# Patient Record
Sex: Male | Born: 1980 | Race: White | Hispanic: No | Marital: Single | Smoking: Never smoker
Health system: Southern US, Community
[De-identification: ages and names within clinical notes are randomized; demographics above are authoritative.]

## PROBLEM LIST (undated history)

## (undated) DIAGNOSIS — S060X9A Concussion with loss of consciousness of unspecified duration, initial encounter: Secondary | ICD-10-CM

## (undated) DIAGNOSIS — E119 Type 2 diabetes mellitus without complications: Secondary | ICD-10-CM

## (undated) HISTORY — DX: Concussion with loss of consciousness of unspecified duration, initial encounter: S06.0X9A

---

## 2002-07-25 HISTORY — PX: WISDOM TOOTH EXTRACTION: SHX21

## 2005-09-13 ENCOUNTER — Ambulatory Visit: Payer: Self-pay | Admitting: Family Medicine

## 2006-06-29 ENCOUNTER — Emergency Department: Payer: Self-pay | Admitting: Unknown Physician Specialty

## 2006-10-04 ENCOUNTER — Ambulatory Visit: Payer: Self-pay | Admitting: Orthopaedic Surgery

## 2006-11-27 ENCOUNTER — Ambulatory Visit: Payer: Self-pay | Admitting: Orthopaedic Surgery

## 2007-07-26 HISTORY — PX: ROTATOR CUFF REPAIR: SHX139

## 2013-02-03 ENCOUNTER — Emergency Department: Payer: Self-pay | Admitting: Emergency Medicine

## 2013-02-03 LAB — CBC
HCT: 41.5 % (ref 40.0–52.0)
HGB: 14.4 g/dL (ref 13.0–18.0)
MCH: 31.3 pg (ref 26.0–34.0)
MCHC: 34.8 g/dL (ref 32.0–36.0)
Platelet: 168 10*3/uL (ref 150–440)
RBC: 4.62 10*6/uL (ref 4.40–5.90)
RDW: 12.4 % (ref 11.5–14.5)

## 2013-02-10 ENCOUNTER — Emergency Department: Payer: Self-pay | Admitting: Internal Medicine

## 2014-02-19 ENCOUNTER — Emergency Department: Payer: Self-pay | Admitting: Emergency Medicine

## 2015-03-11 ENCOUNTER — Telehealth: Payer: Self-pay

## 2015-03-11 ENCOUNTER — Encounter: Payer: Self-pay | Admitting: Family Medicine

## 2015-03-11 ENCOUNTER — Ambulatory Visit (INDEPENDENT_AMBULATORY_CARE_PROVIDER_SITE_OTHER): Admitting: Family Medicine

## 2015-03-11 VITALS — BP 133/75 | HR 72 | Temp 98.4°F | Ht 63.0 in | Wt 175.0 lb

## 2015-03-11 DIAGNOSIS — L259 Unspecified contact dermatitis, unspecified cause: Secondary | ICD-10-CM

## 2015-03-11 MED ORDER — HYDROXYZINE HCL 25 MG PO TABS: 25.0000 mg | ORAL_TABLET | Freq: Four times a day (QID) | ORAL | Status: DC | PRN

## 2015-03-11 MED ORDER — CLOBETASOL PROPIONATE 0.05 % EX CREA: 1.0000 "application " | TOPICAL_CREAM | Freq: Two times a day (BID) | CUTANEOUS | Status: DC

## 2015-03-11 NOTE — Telephone Encounter (Signed)
Pt added to Lada's schedule @ 1:30 for an acute visit. Pt stated he has a bad batch of poison oak. Thanks.

## 2015-03-11 NOTE — Assessment & Plan Note (Signed)
Explained dx; importance of showering right after contact, keeping hands clean, avoiding spread; topical corticosteroid; too strong for face, groin, underarms, children because of risk of striae; oral hydroxyzine if needed, but do not drive if causes somnolence; may be best to use just before bed; avoid contact in future if possible; see AVS

## 2015-03-11 NOTE — Patient Instructions (Addendum)
Use the steroid cream on the rash as directed until cleared for symptoms Use the pills if needed for itching Use super caution in the future and shower immediately after contact Call if any problems  Contact Dermatitis Contact dermatitis is a reaction to certain substances that touch the skin. Contact dermatitis can be either irritant contact dermatitis or allergic contact dermatitis. Irritant contact dermatitis does not require previous exposure to the substance for a reaction to occur.Allergic contact dermatitis only occurs if you have been exposed to the substance before. Upon a repeat exposure, your body reacts to the substance.  CAUSES  Many substances can cause contact dermatitis. Irritant dermatitis is most commonly caused by repeated exposure to mildly irritating substances, such as:  Makeup.  Soaps.  Detergents.  Bleaches.  Acids.  Metal salts, such as nickel. Allergic contact dermatitis is most commonly caused by exposure to:  Poisonous plants.  Chemicals (deodorants, shampoos).  Jewelry.  Latex.  Neomycin in triple antibiotic cream.  Preservatives in products, including clothing. SYMPTOMS  The area of skin that is exposed may develop:  Dryness or flaking.  Redness.  Cracks.  Itching.  Pain or a burning sensation.  Blisters. With allergic contact dermatitis, there may also be swelling in areas such as the eyelids, mouth, or genitals.  DIAGNOSIS  Your caregiver can usually tell what the problem is by doing a physical exam. In cases where the cause is uncertain and an allergic contact dermatitis is suspected, a patch skin test may be performed to help determine the cause of your dermatitis. TREATMENT Treatment includes protecting the skin from further contact with the irritating substance by avoiding that substance if possible. Barrier creams, powders, and gloves may be helpful. Your caregiver may also recommend:  Steroid creams or ointments applied 2  times daily. For best results, soak the rash area in cool water for 20 minutes. Then apply the medicine. Cover the area with a plastic wrap. You can store the steroid cream in the refrigerator for a "chilly" effect on your rash. That may decrease itching. Oral steroid medicines may be needed in more severe cases.  Antibiotics or antibacterial ointments if a skin infection is present.  Antihistamine lotion or an antihistamine taken by mouth to ease itching.  Lubricants to keep moisture in your skin.  Burow's solution to reduce redness and soreness or to dry a weeping rash. Mix one packet or tablet of solution in 2 cups cool water. Dip a clean washcloth in the mixture, wring it out a bit, and put it on the affected area. Leave the cloth in place for 30 minutes. Do this as often as possible throughout the day.  Taking several cornstarch or baking soda baths daily if the area is too large to cover with a washcloth. Harsh chemicals, such as alkalis or acids, can cause skin damage that is like a burn. You should flush your skin for 15 to 20 minutes with cold water after such an exposure. You should also seek immediate medical care after exposure. Bandages (dressings), antibiotics, and pain medicine may be needed for severely irritated skin.  HOME CARE INSTRUCTIONS  Avoid the substance that caused your reaction.  Keep the area of skin that is affected away from hot water, soap, sunlight, chemicals, acidic substances, or anything else that would irritate your skin.  Do not scratch the rash. Scratching may cause the rash to become infected.  You may take cool baths to help stop the itching.  Only take over-the-counter or prescription  medicines as directed by your caregiver.  See your caregiver for follow-up care as directed to make sure your skin is healing properly. SEEK MEDICAL CARE IF:   Your condition is not better after 3 days of treatment.  You seem to be getting worse.  You see signs of  infection such as swelling, tenderness, redness, soreness, or warmth in the affected area.  You have any problems related to your medicines. Document Released: 07/08/2000 Document Revised: 10/03/2011 Document Reviewed: 12/14/2010 [facility] Patient Information 2015 [place], [place]. This information is not intended to replace advice given to you by your health care provider. Make sure you discuss any questions you have with your health care provider.

## 2015-03-11 NOTE — Progress Notes (Signed)
   BP 133/75 mmHg  Pulse 72  Temp(Src) 98.4 F (36.9 C)  Ht  (1.6 m)  Wt 175 lb (79.379 kg)  BMI 31.01 kg/m2  SpO2 98%   Subjective:    Patient ID: Justin Harding, male    DOB: 1981-01-27, 34 y.o.   MRN: [id number]  HPI: Justin Harding is a 34 y.o. male  Chief Complaint  Patient presents with  . Poison [place]   He was exposed to poison oak over the weekend; has had this before and knows what it like Tops of the feet; all over his arms, hands; no oozing Using benadryl and calamine Waited too long last year and is here today to get some medicine No throat closing or lip swelling; there was no burning of plants [facility] and a green [facility] last time this happened He had an episode in which his dog went out and got into it and rubbed up against his right leg; swelled up, itched, sores oozed, it was pretty bad  Relevant past medical, surgical, family and social history reviewed and updated as indicated. Interim medical history since our last visit reviewed. Allergies and medications reviewed and updated.  Review of Systems Per HPI unless specifically indicated above     Objective:    BP 133/75 mmHg  Pulse 72  Temp(Src) 98.4 F (36.9 C)  Ht  (1.6 m)  Wt 175 lb (79.379 kg)  BMI 31.01 kg/m2  SpO2 98%  Wt Readings from Last 3 Encounters:  03/11/15 175 lb (79.379 kg)  08/01/13 182 lb (82.555 kg)    Physical Exam  Constitutional: He appears well-developed and well-nourished.  Muscular build  Cardiovascular: Normal rate.   Pulmonary/Chest: Effort normal.  Skin: Rash noted.  Numerous erythematous papules and vesicles on the arms, hands; some are in linear arrangment consistent with rhus dermatitis  Psychiatric: He has a normal mood and affect. His speech is normal and behavior is normal. Judgment and thought content normal. Cognition and memory are normal.      Assessment & Plan:   Problem List Items Addressed This Visit      Musculoskeletal and Integument   Contact dermatitis - Primary    Explained dx; importance of showering right after contact, keeping hands clean, avoiding spread; topical corticosteroid; too strong for face, groin, underarms, children because of risk of striae; oral hydroxyzine if needed, but do not drive if causes somnolence; may be best to use just before bed; avoid contact in future if possible; see AVS         Follow up plan: Return if symptoms worsen or fail to improve.  Meds ordered this encounter  Medications  . clobetasol cream (TEMOVATE) 0.05 %    Sig: Apply 1 application topically 2 (two) times daily. Too strong for face or underarms or in groin    Dispense:  60 g    Refill:  1  . hydrOXYzine (ATARAX/VISTARIL) 25 MG tablet    Sig: Take 1 tablet (25 mg total) by mouth every 6 (six) hours as needed.    Dispense:  20 tablet    Refill:  1

## 2015-03-26 DIAGNOSIS — S060X9A Concussion with loss of consciousness of unspecified duration, initial encounter: Secondary | ICD-10-CM

## 2015-03-26 DIAGNOSIS — S060XAA Concussion with loss of consciousness status unknown, initial encounter: Secondary | ICD-10-CM

## 2015-03-26 HISTORY — DX: Concussion with loss of consciousness of unspecified duration, initial encounter: S06.0X9A

## 2015-03-26 HISTORY — DX: Concussion with loss of consciousness status unknown, initial encounter: S06.0XAA

## 2015-04-10 ENCOUNTER — Emergency Department (HOSPITAL_BASED_OUTPATIENT_CLINIC_OR_DEPARTMENT_OTHER)

## 2015-04-10 ENCOUNTER — Emergency Department (HOSPITAL_BASED_OUTPATIENT_CLINIC_OR_DEPARTMENT_OTHER)
Admission: EM | Admit: 2015-04-10 | Discharge: 2015-04-10 | Disposition: A | Discharge status: Home / Self Care | Attending: Emergency Medicine | Admitting: Emergency Medicine

## 2015-04-10 ENCOUNTER — Encounter (HOSPITAL_BASED_OUTPATIENT_CLINIC_OR_DEPARTMENT_OTHER): Payer: Self-pay | Admitting: *Deleted

## 2015-04-10 DIAGNOSIS — S4992XA Unspecified injury of left shoulder and upper arm, initial encounter: Secondary | ICD-10-CM | POA: Diagnosis not present

## 2015-04-10 DIAGNOSIS — S299XXA Unspecified injury of thorax, initial encounter: Secondary | ICD-10-CM | POA: Diagnosis not present

## 2015-04-10 DIAGNOSIS — W500XXA Accidental hit or strike by another person, initial encounter: Secondary | ICD-10-CM | POA: Diagnosis not present

## 2015-04-10 DIAGNOSIS — Y9289 Other specified places as the place of occurrence of the external cause: Secondary | ICD-10-CM | POA: Diagnosis not present

## 2015-04-10 DIAGNOSIS — S0990XA Unspecified injury of head, initial encounter: Secondary | ICD-10-CM | POA: Diagnosis present

## 2015-04-10 DIAGNOSIS — Y9389 Activity, other specified: Secondary | ICD-10-CM | POA: Insufficient documentation

## 2015-04-10 DIAGNOSIS — Y998 Other external cause status: Secondary | ICD-10-CM | POA: Insufficient documentation

## 2015-04-10 DIAGNOSIS — S060X0A Concussion without loss of consciousness, initial encounter: Secondary | ICD-10-CM | POA: Diagnosis not present

## 2015-04-10 DIAGNOSIS — S199XXA Unspecified injury of neck, initial encounter: Secondary | ICD-10-CM | POA: Diagnosis not present

## 2015-04-10 MED ORDER — METHOCARBAMOL 500 MG PO TABS: 500.0000 mg | ORAL_TABLET | Freq: Two times a day (BID) | ORAL | Status: DC

## 2015-04-10 MED ORDER — IBUPROFEN 400 MG PO TABS: 400.0000 mg | ORAL_TABLET | Freq: Four times a day (QID) | ORAL | Status: DC | PRN
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
## 2015-04-15 ENCOUNTER — Encounter: Payer: Self-pay | Admitting: Family Medicine

## 2015-04-15 ENCOUNTER — Ambulatory Visit (INDEPENDENT_AMBULATORY_CARE_PROVIDER_SITE_OTHER): Admitting: Family Medicine

## 2015-04-15 VITALS — BP 122/80 | HR 71 | Temp 97.7°F | Ht 63.5 in | Wt 177.0 lb

## 2015-04-15 DIAGNOSIS — S46819A Strain of other muscles, fascia and tendons at shoulder and upper arm level, unspecified arm, initial encounter: Secondary | ICD-10-CM | POA: Insufficient documentation

## 2015-04-15 DIAGNOSIS — S060X0A Concussion without loss of consciousness, initial encounter: Secondary | ICD-10-CM | POA: Diagnosis not present

## 2015-04-15 DIAGNOSIS — S069X9A Unspecified intracranial injury with loss of consciousness of unspecified duration, initial encounter: Secondary | ICD-10-CM | POA: Insufficient documentation

## 2015-04-15 DIAGNOSIS — S06891S Other specified intracranial injury with loss of consciousness of 30 minutes or less, sequela: Secondary | ICD-10-CM | POA: Diagnosis not present

## 2015-04-15 DIAGNOSIS — S069XAA Unspecified intracranial injury with loss of consciousness status unknown, initial encounter: Secondary | ICD-10-CM | POA: Insufficient documentation

## 2015-04-15 DIAGNOSIS — S069X1S Unspecified intracranial injury with loss of consciousness of 30 minutes or less, sequela: Secondary | ICD-10-CM

## 2015-04-15 DIAGNOSIS — S46812A Strain of other muscles, fascia and tendons at shoulder and upper arm level, left arm, initial encounter: Secondary | ICD-10-CM | POA: Diagnosis not present

## 2015-04-15 NOTE — Assessment & Plan Note (Signed)
History of TBI; he is anticipating further testing with the [place]

## 2015-04-15 NOTE — Assessment & Plan Note (Signed)
May continue NSAID and muscle relaxant; do not drive or drink EtOH on the muscle relaxant

## 2015-04-15 NOTE — Assessment & Plan Note (Addendum)
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
## 2015-04-15 NOTE — Patient Instructions (Addendum)
If having any headache at all or any difficulty concentrating, do NOT go to work on Tuesday and call here to make an appointment Do NOT drive on the muscle relaxant and do NOT drink any alcohol (while taking it within eight hours) Wear head protection if riding bike, motorcycle, going skiing, etc., any place you might sustain a head injury Do NOT drive until cleared by neurologist  Concussion A concussion, or closed-head injury, is a brain injury caused by a direct blow to the head or by a quick and sudden movement (jolt) of the head or neck. Concussions are usually not life-threatening. Even so, the effects of a concussion can be serious. If you have had a concussion before, you are more likely to experience concussion-like symptoms after a direct blow to the head.  CAUSES  Direct blow to the head, such as from running into another player during a soccer game, being hit in a fight, or hitting your head on a hard surface.  A jolt of the head or neck that causes the brain to move back and forth inside the skull, such as in a car crash. SIGNS AND SYMPTOMS The signs of a concussion can be hard to notice. Early on, they may be missed by you, family members, and health care providers. You may look fine but act or feel differently. Symptoms are usually temporary, but they may last for days, weeks, or even longer. Some symptoms may appear right away while others may not show up for hours or days. Every head injury is different. Symptoms include:  Mild to moderate headaches that will not go away.  A feeling of pressure inside your head.  Having more trouble than usual:  Learning or remembering things you have heard.  Answering questions.  Paying attention or concentrating.  Organizing daily tasks.  Making decisions and solving problems.  Slowness in thinking, acting or reacting, speaking, or reading.  Getting lost or being easily confused.  Feeling tired all the time or lacking energy  (fatigued).  Feeling drowsy.  Sleep disturbances.  Sleeping more than usual.  Sleeping less than usual.  Trouble falling asleep.  Trouble sleeping (insomnia).  Loss of balance or feeling lightheaded or dizzy.  Nausea or vomiting.  Numbness or tingling.  Increased sensitivity to:  Sounds.  Lights.  Distractions.  Vision problems or eyes that tire easily.  Diminished sense of taste or smell.  Ringing in the ears.  Mood changes such as feeling sad or anxious.  Becoming easily irritated or angry for little or no reason.  Lack of motivation.  Seeing or hearing things other people do not see or hear (hallucinations). DIAGNOSIS Your health care provider can usually diagnose a concussion based on a description of your injury and symptoms. He or she will ask whether you passed out (lost consciousness) and whether you are having trouble remembering events that happened right before and during your injury. Your evaluation might include:  A brain scan to look for signs of injury to the brain. Even if the test shows no injury, you may still have a concussion.  Blood tests to be sure other problems are not present. TREATMENT  Concussions are usually treated in an emergency department, in urgent care, or at a clinic. You may need to stay in the hospital overnight for further treatment.  Tell your health care provider if you are taking any medicines, including prescription medicines, over-the-counter medicines, and natural remedies. Some medicines, such as blood thinners (anticoagulants) and aspirin, may  increase the chance of complications. Also tell your health care provider whether you have had alcohol or are taking illegal drugs. This information may affect treatment.  Your health care provider will send you home with important instructions to follow.  How fast you will recover from a concussion depends on many factors. These factors include how severe your concussion is,  what part of your brain was injured, your age, and how healthy you were before the concussion.  Most people with mild injuries recover fully. Recovery can take time. In general, recovery is slower in older persons. Also, persons who have had a concussion in the past or have other medical problems may find that it takes longer to recover from their current injury. HOME CARE INSTRUCTIONS General Instructions  Carefully follow the directions your health care provider gave you.  Only take over-the-counter or prescription medicines for pain, discomfort, or fever as directed by your health care provider.  Take only those medicines that your health care provider has approved.  Do not drink alcohol until your health care provider says you are well enough to do so. Alcohol and certain other drugs may slow your recovery and can put you at risk of further injury.  If it is harder than usual to remember things, write them down.  If you are easily distracted, try to do one thing at a time. For example, do not try to watch TV while fixing dinner.  Talk with family members or close friends when making important decisions.  Keep all follow-up appointments. Repeated evaluation of your symptoms is recommended for your recovery.  Watch your symptoms and tell others to do the same. Complications sometimes occur after a concussion. Older adults with a brain injury may have a higher risk of serious complications, such as a blood clot on the brain.  Tell your teachers, school nurse, school counselor, coach, athletic trainer, or work Production designer, theatre/television/film about your injury, symptoms, and restrictions. Tell them about what you can or cannot do. They should watch for:  Increased problems with attention or concentration.  Increased difficulty remembering or learning new information.  Increased time needed to complete tasks or assignments.  Increased irritability or decreased ability to cope with stress.  Increased  symptoms.  Rest. Rest helps the brain to heal. Make sure you:  Get plenty of sleep at night. Avoid staying up late at night.  Keep the same bedtime hours on weekends and weekdays.  Rest during the day. Take daytime naps or rest breaks when you feel tired.  Limit activities that require a lot of thought or concentration. These include:  Doing homework or job-related work.  Watching TV.  Working on the computer.  Avoid any situation where there is potential for another head injury (football, hockey, soccer, basketball, martial arts, downhill snow sports and horseback riding). Your condition will get worse every time you experience a concussion. You should avoid these activities until you are evaluated by the appropriate follow-up health care providers. Returning To Your Regular Activities You will need to return to your normal activities slowly, not all at once. You must give your body and brain enough time for recovery.  Do not return to sports or other athletic activities until your health care provider tells you it is safe to do so.  Ask your health care provider when you can drive, ride a bicycle, or operate heavy machinery. Your ability to react may be slower after a brain injury. Never do these activities if you are dizzy.  Ask your health care provider about when you can return to work or school. Preventing Another Concussion It is very important to avoid another brain injury, especially before you have recovered. In rare cases, another injury can lead to permanent brain damage, brain swelling, or death. The risk of this is greatest during the first 7-10 days after a head injury. Avoid injuries by:  Wearing a seat belt when riding in a car.  Drinking alcohol only in moderation.  Wearing a helmet when biking, skiing, skateboarding, skating, or doing similar activities.  Avoiding activities that could lead to a second concussion, such as contact or recreational sports, until  your health care provider says it is okay.  Taking safety measures in your home.  Remove clutter and tripping hazards from floors and stairways.  Use grab bars in bathrooms and handrails by stairs.  Place non-slip mats on floors and in bathtubs.  Improve lighting in dim areas. SEEK MEDICAL CARE IF:  You have increased problems paying attention or concentrating.  You have increased difficulty remembering or learning new information.  You need more time to complete tasks or assignments than before.  You have increased irritability or decreased ability to cope with stress.  You have more symptoms than before. Seek medical care if you have any of the following symptoms for more than 2 weeks after your injury:  Lasting (chronic) headaches.  Dizziness or balance problems.  Nausea.  Vision problems.  Increased sensitivity to noise or light.  Depression or mood swings.  Anxiety or irritability.  Memory problems.  Difficulty concentrating or paying attention.  Sleep problems.  Feeling tired all the time. SEEK IMMEDIATE MEDICAL CARE IF:  You have severe or worsening headaches. These may be a sign of a blood clot in the brain.  You have weakness (even if only in one hand, leg, or part of the face).  You have numbness.  You have decreased coordination.  You vomit repeatedly.  You have increased sleepiness.  One pupil is larger than the other.  You have convulsions.  You have slurred speech.  You have increased confusion. This may be a sign of a blood clot in the brain.  You have increased restlessness, agitation, or irritability.  You are unable to recognize people or places.  You have neck pain.  It is difficult to wake you up.  You have unusual behavior changes.  You lose consciousness. MAKE SURE YOU:  Understand these instructions.  Will watch your condition.  Will get help right away if you are not doing well or get worse. Document  Released: 10/01/2003 Document Revised: 07/16/2013 Document Reviewed: 01/31/2013 [facility] Patient Information 2015 [place], [place]. This information is not intended to replace advice given to you by your health care provider. Make sure you discuss any questions you have with your health care provider.

## 2015-04-17 ENCOUNTER — Ambulatory Visit (INDEPENDENT_AMBULATORY_CARE_PROVIDER_SITE_OTHER): Admitting: Diagnostic Neuroimaging

## 2015-04-17 ENCOUNTER — Encounter: Payer: Self-pay | Admitting: Diagnostic Neuroimaging

## 2015-04-17 VITALS — BP 141/88 | HR 60 | Ht 63.0 in | Wt 176.4 lb

## 2015-04-17 DIAGNOSIS — F0781 Postconcussional syndrome: Secondary | ICD-10-CM
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
## 2015-04-17 NOTE — Patient Instructions (Signed)
  To whom it may concern,  I saw Justin Harding in my neurology clinic today on 04/17/15.   He may return to work with no restictions on 04/21/15.  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   Sincerely,   Suanne Marker, MD 04/17/2015, 12:22 PM Certified in Neurology, Neurophysiology and Neuroimaging  [facility] [address], [address] [place], [place] [address] [phone]

## 2015-09-21 ENCOUNTER — Emergency Department
Admission: EM | Admit: 2015-09-21 | Discharge: 2015-09-21 | Disposition: A | Discharge status: Home / Self Care | Attending: Emergency Medicine | Admitting: Emergency Medicine

## 2015-09-21 ENCOUNTER — Emergency Department

## 2015-09-21 ENCOUNTER — Encounter: Payer: Self-pay | Admitting: *Deleted

## 2015-09-21 DIAGNOSIS — Y9389 Activity, other specified: Secondary | ICD-10-CM | POA: Insufficient documentation

## 2015-09-21 DIAGNOSIS — S93401A Sprain of unspecified ligament of right ankle, initial encounter: Secondary | ICD-10-CM | POA: Insufficient documentation

## 2015-09-21 DIAGNOSIS — W1842XA Slipping, tripping and stumbling without falling due to stepping into hole or opening, initial encounter: Secondary | ICD-10-CM | POA: Diagnosis not present

## 2015-09-21 DIAGNOSIS — Z79899 Other long term (current) drug therapy: Secondary | ICD-10-CM | POA: Insufficient documentation

## 2015-09-21 DIAGNOSIS — Y9289 Other specified places as the place of occurrence of the external cause: Secondary | ICD-10-CM | POA: Diagnosis not present

## 2015-09-21 DIAGNOSIS — Y998 Other external cause status: Secondary | ICD-10-CM | POA: Diagnosis not present

## 2015-09-21 DIAGNOSIS — S99911A Unspecified injury of right ankle, initial encounter: Secondary | ICD-10-CM | POA: Diagnosis present

## 2015-09-21 MED ORDER — NAPROXEN 500 MG PO TABS: 500.0000 mg | ORAL_TABLET | Freq: Two times a day (BID) | ORAL | Status: DC

## 2015-09-21 MED ORDER — HYDROCODONE-ACETAMINOPHEN 5-325 MG PO TABS: 1.0000 | ORAL_TABLET | ORAL | Status: DC | PRN

## 2015-09-21 NOTE — ED Notes (Addendum)
States he rolled his right ankle stepping off unlevel ground today

## 2015-09-21 NOTE — ED Provider Notes (Signed)
[facility] Emergency Department Provider Note  ____________________________________________  Time seen: On arrival  I have reviewed the triage vital signs and the nursing notes.   HISTORY  Chief Complaint Ankle Pain    HPI Justin Harding is a 35 y.o. male who presents with complaints of moderate to severe right ankle pain. Patient reports he rolled his ankle externally on a piece of uneven pavement. He complains of pain to the lateral aspect. He has been unable to walk on it. No other injuries    Past Medical History  Diagnosis Date  . Concussion Sept. 2016    Patient Active Problem List   Diagnosis Date Noted  . 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 Concussion w/o coma 04/15/2015  . Trapezius muscle strain 04/15/2015    Past Surgical History  Procedure Laterality Date  . Wisdom tooth extraction  2004  . Rotator cuff repair  2009    Current Outpatient Rx  Name  Route  Sig  Dispense  Refill  . ibuprofen (ADVIL,MOTRIN) 400 MG tablet   Oral   Take 1 tablet (400 mg total) by mouth every 6 (six) hours as needed.   30 tablet   0   . methocarbamol (ROBAXIN) 500 MG tablet   Oral   Take 1 tablet (500 mg total) by mouth 2 (two) times daily.   20 tablet   0     Allergies Review of patient's allergies indicates no known allergies.  Family History  Problem Relation Age of Onset  . Cancer Mother     tumor removed from leg  . Diabetes Mother   . Hyperlipidemia Mother   . Hypertension Mother   . Arthritis Father   . Cancer Father     prostate  . Hyperlipidemia Father   . Hypertension Father     Social History Social History  Substance Use Topics  . Smoking status: Never Smoker   . Smokeless tobacco: Current User    Types: Chew  . Alcohol Use: 0.6 oz/week    1 Cans of beer per week     Comment: occasional beer    Review of Systems        Musculoskeletal: Ankle pain as above Skin: Negative for abrasion  or laceration    ____________________________________________   PHYSICAL EXAM:  VITAL SIGNS: ED Triage Vitals  Enc Vitals Group     BP 09/21/15 1841 137/75 mmHg     Pulse Rate 09/21/15 1841 73     Resp 09/21/15 1841 18     Temp 09/21/15 1841 98.6 F (37 C)     Temp Source 09/21/15 1841 Oral     SpO2 09/21/15 1841 98 %     Weight 09/21/15 1841 170 lb (77.111 kg)     Height 09/21/15 1841  (1.6 m)     Head Cir --      Peak Flow --      Pain Score 09/21/15 1842 4     Pain Loc --      Pain Edu? --      Excl. in GC? --      Constitutional: Alert and oriented. Well appearing and in no distress. Eyes: Conjunctivae are normal.  ENT   Head: Normocephalic and atraumatic.   Mouth/Throat: Mucous membranes are moist.   Musculoskeletal: Mild swelling to the lateral aspect of the right ankle. Tenderness over the anterior talofibular ligament. No bony abnormalities felt. 2+ distal pulses Neurologic:  Normal speech and language.  No gross focal neurologic deficits are appreciated. Skin:  Skin is warm, dry and intact. No rash noted. Psychiatric: Mood and affect are normal. Patient exhibits appropriate insight and judgment.  ____________________________________________    LABS (pertinent positives/negatives)  Labs Reviewed - No data to display  ____________________________________________     ____________________________________________    RADIOLOGY I have personally reviewed any xrays that were ordered on this patient: X-ray shows no fracture  ____________________________________________   PROCEDURES  Procedure(s) performed: Splint applied by tech, posterior splint, Ortho-Glass, Ace wrap, splint placement verified by me   ____________________________________________   INITIAL IMPRESSION / ASSESSMENT AND PLAN / ED COURSE  Pertinent labs & imaging results that were available during my care of the patient were reviewed by me and considered in my medical  decision making (see chart for details).  Exam most consistent with ankle sprain. Pending x-rays.  ----------------------------------------- 7:47 PM on 09/21/2015 -----------------------------------------  X-ray unremarkable.  ____________________________________________   FINAL CLINICAL IMPRESSION(S) / ED DIAGNOSES  Final diagnoses:  Ankle sprain, right, initial encounter     Jene Every, MD 09/21/15 [phone]

## 2015-09-21 NOTE — ED Notes (Signed)
Patient transported to X-ray via stretcher 

## 2015-09-21 NOTE — Discharge Instructions (Signed)
Ankle Sprain  An ankle sprain is an injury to the strong, fibrous tissues (ligaments) that hold the bones of your ankle joint together.   CAUSES  An ankle sprain is usually caused by a fall or by twisting your ankle. Ankle sprains most commonly occur when you step on the outer edge of your foot, and your ankle turns inward. People who participate in sports are more prone to these types of injuries.   SYMPTOMS    Pain in your ankle. The pain may be present at rest or only when you are trying to stand or walk.   Swelling.   Bruising. Bruising may develop immediately or within 1 to 2 days after your injury.   Difficulty standing or walking, particularly when turning corners or changing directions.  DIAGNOSIS   Your caregiver will ask you details about your injury and perform a physical exam of your ankle to determine if you have an ankle sprain. During the physical exam, your caregiver will press on and apply pressure to specific areas of your foot and ankle. Your caregiver will try to move your ankle in certain ways. An X-ray exam may be done to be sure a bone was not broken or a ligament did not separate from one of the bones in your ankle (avulsion fracture).   TREATMENT   Certain types of braces can help stabilize your ankle. Your caregiver can make a recommendation for this. Your caregiver may recommend the use of medicine for pain. If your sprain is severe, your caregiver may refer you to a surgeon who helps to restore function to parts of your skeletal system (orthopedist) or a physical therapist.  HOME CARE INSTRUCTIONS    Apply ice to your injury for 1-2 days or as directed by your caregiver. Applying ice helps to reduce inflammation and pain.    Put ice in a plastic bag.    Place a towel between your skin and the bag.    Leave the ice on for 15-20 minutes at a time, every 2 hours while you are awake.   Only take over-the-counter or prescription medicines for pain, discomfort, or fever as directed by  your caregiver.   Elevate your injured ankle above the level of your heart as much as possible for 2-3 days.   If your caregiver recommends crutches, use them as instructed. Gradually put weight on the affected ankle. Continue to use crutches or a cane until you can walk without feeling pain in your ankle.   If you have a plaster splint, wear the splint as directed by your caregiver. Do not rest it on anything harder than a pillow for the first 24 hours. Do not put weight on it. Do not get it wet. You may take it off to take a shower or bath.   You may have been given an elastic bandage to wear around your ankle to provide support. If the elastic bandage is too tight (you have numbness or tingling in your foot or your foot becomes cold and blue), adjust the bandage to make it comfortable.   If you have an air splint, you may blow more air into it or let air out to make it more comfortable. You may take your splint off at night and before taking a shower or bath. Wiggle your toes in the splint several times per day to decrease swelling.  SEEK MEDICAL CARE IF:    You have rapidly increasing bruising or swelling.   Your toes feel   extremely cold or you lose feeling in your foot.   Your pain is not relieved with medicine.  SEEK IMMEDIATE MEDICAL CARE IF:   Your toes are numb or blue.   You have severe pain that is increasing.  MAKE SURE YOU:    Understand these instructions.   Will watch your condition.   Will get help right away if you are not doing well or get worse.     This information is not intended to replace advice given to you by your health care provider. Make sure you discuss any questions you have with your health care provider.     Document Released: 07/11/2005 Document Revised: 08/01/2014 Document Reviewed: 07/23/2011  Elsevier Interactive Patient Education 2016 [employer]

## 2015-09-21 NOTE — ED Notes (Signed)
Attempted to reach [facility] [phone] and  [phone] no answer as well as Evaristo Bury [phone] no answer as well to see if worker's comp was needed to be completed. Pt notified of attempt and instructed to follow up with supervisor in the morning. Pt verbally acknowledged.

## 2015-09-21 NOTE — ED Notes (Signed)
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
## 2016-06-28 ENCOUNTER — Ambulatory Visit (INDEPENDENT_AMBULATORY_CARE_PROVIDER_SITE_OTHER): Admitting: Family Medicine

## 2016-06-28 ENCOUNTER — Encounter: Payer: Self-pay | Admitting: Family Medicine

## 2016-06-28 VITALS — BP 117/79 | HR 100 | Temp 99.1°F | Wt 183.0 lb

## 2016-06-28 DIAGNOSIS — J029 Acute pharyngitis, unspecified: Secondary | ICD-10-CM

## 2016-06-28 DIAGNOSIS — R509 Fever, unspecified: Secondary | ICD-10-CM

## 2016-06-28 MED ORDER — OSELTAMIVIR PHOSPHATE 75 MG PO CAPS: 75.0000 mg | ORAL_CAPSULE | Freq: Two times a day (BID) | ORAL | 0 refills | Status: DC

## 2016-06-28 MED ORDER — LIDOCAINE VISCOUS 2 % MT SOLN: 5.0000 mL | OROMUCOSAL | 0 refills | Status: DC | PRN

## 2016-06-28 NOTE — Progress Notes (Signed)
   BP 117/79   Pulse 100   Temp 99.1 F (37.3 C)   Wt 183 lb (83 kg)   SpO2 99%   BMI 32.42 kg/m    Subjective:    Patient ID: Justin SpenceWilliam C Kerrick, male    DOB: 02-04-1981, 35 y.o.   MRN: [id number]  HPI: Justin Harding is a 35 y.o. male  Chief Complaint  Patient presents with  . URI    x 2 days. yesterday morning, felt bad, body aches, legs feel heavy, head ache, neck ache, sinus pressure, sore throat, some cough, head congestion. No runny nose.   Patient presents with 2 day history of sudden, severe body aches, chills, sweats, sore throat, and congestion. States he feels like a bus ran over him. Denies CP, SOB, or wheezing. Taking tylenol and advil. Concerned as he has a 471 month old daughter.   Relevant past medical, surgical, family and social history reviewed and updated as indicated. Interim medical history since our last visit reviewed. Allergies and medications reviewed and updated.  Review of Systems  Constitutional: Positive for chills, diaphoresis, fatigue and fever.  HENT: Positive for congestion and sore throat.   Eyes: Negative.   Respiratory: Negative.   Cardiovascular: Negative.   Gastrointestinal: Negative.   Genitourinary: Negative.   Musculoskeletal: Positive for myalgias.  Skin: Negative.   Neurological: Negative.   Psychiatric/Behavioral: Negative.     Per HPI unless specifically indicated above     Objective:    BP 117/79   Pulse 100   Temp 99.1 F (37.3 C)   Wt 183 lb (83 kg)   SpO2 99%   BMI 32.42 kg/m   Wt Readings from Last 3 Encounters:  06/28/16 183 lb (83 kg)  09/21/15 170 lb (77.1 kg)  04/17/15 176 lb 6.4 oz (80 kg)    Physical Exam  Constitutional: He is oriented to person, place, and time. He appears well-developed and well-nourished. No distress.  HENT:  Head: Atraumatic.  Right Ear: External ear normal.  Left Ear: External ear normal.  Nose: Nose normal.  Oropharynx erythematous  Eyes: Conjunctivae are normal. Pupils  are equal, round, and reactive to light.  Neck: Normal range of motion. Neck supple.  Cardiovascular: Normal rate and normal heart sounds.   Pulmonary/Chest: Effort normal and breath sounds normal.  Musculoskeletal: Normal range of motion.  Neurological: He is alert and oriented to person, place, and time.  Skin: Skin is warm and dry.  Psychiatric: He has a normal mood and affect. His behavior is normal.  Nursing note and vitals reviewed.     Assessment & Plan:   Problem List Items Addressed This Visit    None    Visit Diagnoses    Sore throat    -  Primary   Rapid strep negative. Viscous lidocaine sent for symptomatic relief. Advil for pain   Relevant Orders   [facility] ([facility])   Fever, unspecified fever cause       Rapid flu negative, but will treat empirically with tamiflu. Work note given, discussed precautions at home as he has infant. Follow up if no improvement   Relevant Orders   Influenza a and b       Follow up plan: Return if symptoms worsen or fail to improve.

## 2016-06-28 NOTE — Patient Instructions (Signed)
Follow up as needed

## 2016-07-01 LAB — CULTURE, GROUP A STREP

## 2016-07-01 LAB — RAPID STREP SCREEN (MED CTR MEBANE ONLY): Strep Gp A Ag, IA W/Reflex: NEGATIVE

## 2016-07-04 ENCOUNTER — Telehealth: Payer: Self-pay | Admitting: Family Medicine

## 2016-07-04 MED ORDER — AMOXICILLIN 875 MG PO TABS: 875.0000 mg | ORAL_TABLET | Freq: Two times a day (BID) | ORAL | 0 refills | Status: DC

## 2016-07-04 NOTE — Telephone Encounter (Signed)
Patient notified

## 2016-07-04 NOTE — Telephone Encounter (Signed)
Please call pt and let him know that his final throat cultures did grow out strep bacteria, so I am sending in an antibiotic to make sure we take care of all the bacteria. He should take the entire course even if he is feeling better.

## 2016-07-20 LAB — INFLUENZA A AND B
INFLUENZA A AG, EIA: NEGATIVE
INFLUENZA B AG, EIA: NEGATIVE

## 2016-07-20 LAB — PLEASE NOTE:
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
## 2017-04-25 ENCOUNTER — Encounter: Payer: Self-pay | Admitting: Unknown Physician Specialty

## 2017-04-25 ENCOUNTER — Ambulatory Visit (INDEPENDENT_AMBULATORY_CARE_PROVIDER_SITE_OTHER): Admitting: Unknown Physician Specialty

## 2017-04-25 VITALS — BP 127/85 | HR 92 | Temp 98.2°F | Ht 63.7 in | Wt 181.6 lb

## 2017-04-25 DIAGNOSIS — L2082 Flexural eczema: Secondary | ICD-10-CM

## 2017-04-25 DIAGNOSIS — H01133 Eczematous dermatitis of right eye, unspecified eyelid: Secondary | ICD-10-CM

## 2017-04-25 DIAGNOSIS — L0291 Cutaneous abscess, unspecified: Secondary | ICD-10-CM

## 2017-04-25 DIAGNOSIS — R55 Syncope and collapse: Secondary | ICD-10-CM | POA: Diagnosis not present

## 2017-04-25 MED ORDER — PREDNISONE 20 MG PO TABS: 20.0000 mg | ORAL_TABLET | Freq: Every day | ORAL | 0 refills | Status: AC

## 2017-04-25 MED ORDER — TRIAMCINOLONE ACETONIDE 0.1 % EX OINT: 1.0000 "application " | TOPICAL_OINTMENT | Freq: Two times a day (BID) | CUTANEOUS | 0 refills | Status: DC

## 2017-04-25 MED ORDER — DOXYCYCLINE HYCLATE 100 MG PO TABS: 100.0000 mg | ORAL_TABLET | Freq: Two times a day (BID) | ORAL | 0 refills | Status: DC

## 2017-04-25 NOTE — Progress Notes (Signed)
BP 127/85   Pulse 92   Temp 98.2 F (36.8 C)   Ht 5' 3.7" (1.618 m)   Wt 181 lb 9.6 oz (82.4 kg)   SpO2 98%   BMI 31.47 kg/m    Subjective:    Patient ID: Justin Harding, male    DOB: 03/19/1981, 36 y.o.   MRN: [id number]  HPI: Justin Harding is a 36 y.o. male  Chief Complaint  Patient presents with  . Rash    pt states he has a rash around his right eye and on both elbows that came up 3 weeks ago    Pt is here with complaints of a rash around his right eye for about 3 weeks.  States it itches and has been scratching and rubbing it.  Pt also has rash bilateral elbows.  Mostly scaley rash with right one having an area which looks painful.    Relevant past medical, surgical, family and social history reviewed and updated as indicated. Interim medical history since our last visit reviewed. Allergies and medications reviewed and updated.  Review of Systems  Per HPI unless specifically indicated above     Objective:    BP 127/85   Pulse 92   Temp 98.2 F (36.8 C)   Ht 5' 3.7" (1.618 m)   Wt 181 lb 9.6 oz (82.4 kg)   SpO2 98%   BMI 31.47 kg/m   Wt Readings from Last 3 Encounters:  04/25/17 181 lb 9.6 oz (82.4 kg)  06/28/16 183 lb (83 kg)  09/21/15 170 lb (77.1 kg)    Physical Exam  Constitutional: He is oriented to person, place, and time. He appears well-developed and well-nourished. No distress.  HENT:  Head: Normocephalic and atraumatic.  Eyes: Conjunctivae and lids are normal. Right eye exhibits no discharge. Left eye exhibits no discharge. No scleral icterus.  Neck: Normal range of motion. Neck supple. No JVD present. Carotid bruit is not present.  Cardiovascular: Normal rate, regular rhythm and normal heart sounds.   Pulmonary/Chest: Effort normal and breath sounds normal. No respiratory distress.  Abdominal: Normal appearance. There is no splenomegaly or hepatomegaly.  Musculoskeletal: Normal range of motion.  Neurological: He is alert and oriented  to person, place, and time.  Skin: Skin is warm, dry and intact. No pallor.  Right eye with scaley area around eye but not inside eye.  Bilateral elbows with dry scaley skin.  Right elbow with small abscessed area.    Psychiatric: He has a normal mood and affect. His behavior is normal. Judgment and thought content normal.    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  Quarter sized.  non-bursal.  I & D performed.  Unable to drain and pus.  ? insect bite.  Doxycycline prescribed for 10 days   Vaso vagal episode       following I&D.  Pt threw up and faint for about 2 minutes.  When he left color was normal and felt fine.  Maintianid BP      For Eczema:  Rx for steroid ointment.  Place a small amount around eye but no longer than 5 days.  Short course of oral steroids  Follow up plan: Return if symptoms worsen or fail to improve.

## 2017-04-26 ENCOUNTER — Ambulatory Visit: Admitting: Family Medicine

## 2017-05-04 ENCOUNTER — Telehealth: Payer: Self-pay | Admitting: Family Medicine

## 2017-05-04 NOTE — Telephone Encounter (Signed)
Patient dropped off forms to be filled out by [employer]. He states it was in regards to an issue she treated last week.  Placed the forms in Rachels basket.  Thanks  [phone]  Ashley Murrain

## 2017-05-04 NOTE — Telephone Encounter (Signed)
Routing to provider.  Forms given to [facility].

## 2017-05-09 NOTE — Telephone Encounter (Signed)
Patient actually saw Justin Harding for the issue he needs paperwork completed. Forms given to [name].

## 2017-10-12 ENCOUNTER — Encounter: Payer: Self-pay | Admitting: Family Medicine

## 2017-10-12 ENCOUNTER — Ambulatory Visit (INDEPENDENT_AMBULATORY_CARE_PROVIDER_SITE_OTHER): Admitting: Family Medicine

## 2017-10-12 VITALS — BP 122/84 | HR 94 | Temp 98.4°F | Wt 181.4 lb

## 2017-10-12 DIAGNOSIS — R062 Wheezing: Secondary | ICD-10-CM

## 2017-10-12 DIAGNOSIS — J0110 Acute frontal sinusitis, unspecified: Secondary | ICD-10-CM

## 2017-10-12 MED ORDER — PREDNISONE 50 MG PO TABS: 50.0000 mg | ORAL_TABLET | Freq: Every day | ORAL | 0 refills | Status: DC

## 2017-10-12 MED ORDER — DOXYCYCLINE HYCLATE 100 MG PO TABS: 100.0000 mg | ORAL_TABLET | Freq: Two times a day (BID) | ORAL | 0 refills | Status: AC

## 2017-10-12 NOTE — Progress Notes (Signed)
BP 122/84 (BP Location: Left Arm, Patient Position: Sitting, Cuff Size: Large)   Pulse 94   Temp 98.4 F (36.9 C) (Oral)   Wt 181 lb 7 oz (82.3 kg)   SpO2 95%   BMI 31.44 kg/m    Subjective:    Patient ID: Justin Harding, male    DOB: 04-Mar-1981, 37 y.o.   MRN: [id number]  HPI: Justin Harding is a 37 y.o. male  Chief Complaint  Patient presents with  . URI    cough productive, congestion and headache.   UPPER RESPIRATORY TRACT INFECTION Duration: about a week and a half Worst symptom: congestion and runny nose Fever: no Cough: yes Shortness of breath: no Wheezing: yes Chest pain: no Chest tightness: no Chest congestion: yes Nasal congestion: yes Runny nose: yes Post nasal drip: yes Sneezing: yes Sore throat: yes Swollen glands: no Sinus pressure: no Headache: yes- had a bad headache for 2 days, took a co-worker's imitrex and that cut it out Face pain: yes- right above L eye Toothache: no Ear pain: no  Ear pressure: no  Eyes red/itching:ye Eye drainage/crusting: yes  Vomiting: no Rash: no Fatigue: yes Sick contacts: yes Strep contacts: no  Context: stable Recurrent sinusitis: no Relief with OTC cold/cough medications: no  Treatments attempted: mucinex and pseudoephedrine    Relevant past medical, surgical, family and social history reviewed and updated as indicated. Interim medical history since our last visit reviewed. Allergies and medications reviewed and updated.  Review of Systems  Constitutional: Negative.   HENT: Positive for congestion, postnasal drip, rhinorrhea, sinus pressure, sinus pain and sneezing. Negative for dental problem, drooling, ear discharge, ear pain, facial swelling, hearing loss, mouth sores, nosebleeds, sore throat, tinnitus, trouble swallowing and voice change.   Respiratory: Positive for shortness of breath and wheezing. Negative for apnea, cough, choking, chest tightness and stridor.   Cardiovascular: Negative.     Neurological: Negative.   Psychiatric/Behavioral: Negative.     Per HPI unless specifically indicated above     Objective:    BP 122/84 (BP Location: Left Arm, Patient Position: Sitting, Cuff Size: Large)   Pulse 94   Temp 98.4 F (36.9 C) (Oral)   Wt 181 lb 7 oz (82.3 kg)   SpO2 95%   BMI 31.44 kg/m   Wt Readings from Last 3 Encounters:  10/12/17 181 lb 7 oz (82.3 kg)  04/25/17 181 lb 9.6 oz (82.4 kg)  06/28/16 183 lb (83 kg)    Physical Exam  Constitutional: He is oriented to person, place, and time. He appears well-developed and well-nourished. No distress.  HENT:  Head: Normocephalic and atraumatic.  Right Ear: Hearing, tympanic membrane, external ear and ear canal normal.  Left Ear: Hearing, tympanic membrane, external ear and ear canal normal.  Nose: Mucosal edema and rhinorrhea present. No nose lacerations, sinus tenderness, nasal deformity, septal deviation or nasal septal hematoma. No epistaxis.  No foreign bodies. Right sinus exhibits no maxillary sinus tenderness and no frontal sinus tenderness. Left sinus exhibits frontal sinus tenderness. Left sinus exhibits no maxillary sinus tenderness.  Mouth/Throat: Uvula is midline, oropharynx is clear and moist and mucous membranes are normal. No oropharyngeal exudate.  Eyes: Pupils are equal, round, and reactive to light. Conjunctivae, EOM and lids are normal. Right eye exhibits no discharge. Left eye exhibits no discharge. No scleral icterus.  Neck: Normal range of motion. Neck supple. No JVD present. No tracheal deviation present. No thyromegaly present.  Cardiovascular: Normal rate, regular rhythm, normal  heart sounds and intact distal pulses. Exam reveals no gallop and no friction rub.  No murmur heard. Pulmonary/Chest: Effort normal and breath sounds normal. No stridor. No respiratory distress. He has no wheezes. He has no rales. He exhibits no tenderness.  Musculoskeletal: Normal range of motion.  Lymphadenopathy:     He has no cervical adenopathy.  Neurological: He is alert and oriented to person, place, and time.  Skin: Skin is warm, dry and intact. No rash noted. He is not diaphoretic. No erythema. No pallor.  Psychiatric: He has a normal mood and affect. His speech is normal and behavior is normal. Judgment and thought content normal. Cognition and memory are normal.    Results for orders placed or performed in visit on 06/28/16  Rapid strep screen (not at [facility])  Result Value Ref Range   Strep Gp A Ag, IA W/Reflex Negative Negative  Influenza a and b  Result Value Ref Range   Influenza A Ag, EIA Negative Negative   Influenza B Ag, EIA Negative Negative   Influenza Comment See note   Culture, Group A Strep  Result Value Ref Range   Strep A Culture Comment (A)   Please note:  Result Value Ref Range   Please note: Comment       Assessment & Plan:   Problem List Items Addressed This Visit    None    Visit Diagnoses    Acute non-recurrent frontal sinusitis    -  Primary   Likely the cause of his headache. Will treat with doxycyline and prednisone. Call if not getting better or getting worse.    Relevant Medications   doxycycline (VIBRA-TABS) 100 MG tablet   predniSONE (DELTASONE) 50 MG tablet   Wheezing       Will treat with prednisone and doxy and recheck 2 weeks. Call with any concerns or if not getting better.        Follow up plan: Return in about 2 weeks (around 10/26/2017) for recheck lungs.

## 2018-03-13 ENCOUNTER — Ambulatory Visit: Admitting: Physician Assistant

## 2023-06-09 ENCOUNTER — Ambulatory Visit: Admission: EM | Admit: 2023-06-09 | Discharge: 2023-06-09 | Disposition: A | Discharge status: Home / Self Care

## 2023-06-09 DIAGNOSIS — U071 COVID-19: Secondary | ICD-10-CM | POA: Diagnosis not present

## 2023-06-09 HISTORY — DX: Type 2 diabetes mellitus without complications: E11.9

## 2023-06-09 LAB — POC COVID19/FLU A&B COMBO
Covid Antigen, POC: POSITIVE — AB
Influenza A Antigen, POC: NEGATIVE
Influenza B Antigen, POC: NEGATIVE

## 2023-06-09 MED ORDER — ALBUTEROL SULFATE HFA 108 (90 BASE) MCG/ACT IN AERS: 1.0000 | INHALATION_SPRAY | Freq: Four times a day (QID) | RESPIRATORY_TRACT | 0 refills | Status: AC | PRN

## 2023-06-09 MED ORDER — PREDNISONE 10 MG (21) PO TBPK: ORAL_TABLET | Freq: Every day | ORAL | 0 refills | Status: DC

## 2023-06-09 NOTE — ED Provider Notes (Signed)
Justin Harding    CSN: [id number] Arrival date & time: 06/09/23  1418      History   Chief Complaint Chief Complaint  Patient presents with   Cough   Nasal Congestion    HPI Justin Harding is a 42 y.o. male.  Patient presents on day 5 of congestion, cough, wheezing.  No fever, chest pain, shortness of breath, or other symptoms.  No OTC medications taken.  He reports yearly seasonal episodes of bronchitis and has required an albuterol inhaler in the past.  His medical history includes diabetes.  He takes metformin.  The history is provided by the patient and medical records.    Past Medical History:  Diagnosis Date   Concussion 03/26/2015   Diabetes mellitus without complication [facility])     Patient Active Problem List   Diagnosis Date Noted   Eczematous dermatitis of eyelid of right eye 04/25/2017   Traumatic brain injury, closed (HCC) 04/15/2015   Concussion w/o coma 04/15/2015   Trapezius muscle strain 04/15/2015    Past Surgical History:  Procedure Laterality Date   ROTATOR CUFF REPAIR  2009   WISDOM TOOTH EXTRACTION  2004       Home Medications    Prior to Admission medications   Medication Sig Start Date End Date Taking? Authorizing Provider  albuterol (VENTOLIN HFA) 108 (90 Base) MCG/ACT inhaler Inhale 1-2 puffs into the lungs every 6 (six) hours as needed. 06/09/23  Yes Mickie Bail, NP  metFORMIN (GLUCOPHAGE-XR) 500 MG 24 hr tablet Take by mouth. 05/11/23  Yes [provider]  methylphenidate (RITALIN) 5 MG tablet  02/10/21  Yes [provider]  predniSONE (STERAPRED UNI-PAK 21 TAB) 10 MG (21) TBPK tablet Take by mouth daily. As directed 06/09/23  Yes Mickie Bail, NP  doxycycline (VIBRA-TABS) 100 MG tablet Take 1 tablet (100 mg total) by mouth 2 (two) times daily. Patient not taking: Reported on 06/09/2023 10/12/17   Olevia Perches P, DO  triamcinolone ointment (KENALOG) 0.1 % Apply 1 application topically 2 (two) times  daily. Patient not taking: Reported on 10/12/2017 04/25/17   Gabriel Cirri, NP  venlafaxine [facility]) 100 MG tablet Take 100 mg by mouth daily.    [provider]    Family History Family History  Problem Relation Age of Onset   Cancer Mother        tumor removed from leg   Diabetes Mother    Hyperlipidemia Mother    Hypertension Mother    Arthritis Father    Cancer Father        prostate   Hyperlipidemia Father    Hypertension Father     Social History Social History   Tobacco Use   Smoking status: Never   Smokeless tobacco: Former    Types: Chew    Quit date: 05/25/2016  Substance Use Topics   Alcohol use: Yes    Alcohol/week: 1.0 standard drink of alcohol    Types: 1 Cans of beer per week    Comment: occasional beer   Drug use: No     Allergies   Patient has no known allergies.   Review of Systems Review of Systems  Constitutional:  Negative for chills and fever.  HENT:  Positive for congestion. Negative for ear pain and sore throat.   Respiratory:  Positive for cough. Negative for shortness of breath.   Cardiovascular:  Negative for chest pain and palpitations.  Gastrointestinal:  Negative for diarrhea and vomiting.  Physical Exam Triage Vital Signs ED Triage Vitals  Encounter Vitals Group     BP 06/09/23 1542 128/85     Systolic BP Percentile --      Diastolic BP Percentile --      Pulse Rate 06/09/23 1527 75     Resp 06/09/23 1527 18     Temp 06/09/23 1527 98 F (36.7 C)     Temp src --      SpO2 06/09/23 1527 96 %     Weight --      Height --      Head Circumference --      Peak Flow --      Pain Score 06/09/23 1543 3     Pain Loc --      Pain Education --      Exclude from Growth Chart --    No data found.  Updated Vital Signs BP 128/85   Pulse 75   Temp 98 F (36.7 C)   Resp 18   SpO2 96%   Visual Acuity Right Eye Distance:   Left Eye Distance:   Bilateral Distance:    Right Eye Near:   Left Eye Near:     Bilateral Near:     Physical Exam Constitutional:      General: He is not in acute distress. HENT:     Right Ear: Tympanic membrane normal.     Left Ear: Tympanic membrane normal.     Nose: Nose normal.     Mouth/Throat:     Mouth: Mucous membranes are moist.     Pharynx: Oropharynx is clear.  Cardiovascular:     Rate and Rhythm: Normal rate and regular rhythm.     Heart sounds: Normal heart sounds.  Pulmonary:     Effort: Pulmonary effort is normal. No respiratory distress.     Breath sounds: Normal breath sounds.  Skin:    General: Skin is warm and dry.  Neurological:     Mental Status: He is alert.      UC Treatments / Results  Labs (all labs ordered are listed, but only abnormal results are displayed) Labs Reviewed  POC COVID19/FLU A&B COMBO - Abnormal; Notable for the following components:      Result Value   Covid Antigen, POC Positive (*)    All other components within normal limits    EKG   Radiology No results found.  Procedures Procedures (including critical care time)  Medications Ordered in UC Medications - No data to display  Initial Impression / Assessment and Plan / UC Course  I have reviewed the triage vital signs and the nursing notes.  Pertinent labs & imaging results that were available during my care of the patient were reviewed by me and considered in my medical decision making (see chart for details).    COVID-19.  Rapid COVID positive.  Rapid flu negative.  Patient is not interested in antiviral treatment.  Treating today with albuterol inhaler and prednisone taper.  Tylenol or ibuprofen as needed.  Instructed him to follow-up with his PCP on Monday.  ED precautions given.  Education provided on COVID.  He agrees to plan of care.  Final Clinical Impressions(s) / UC Diagnoses   Final diagnoses:  COVID-19     Discharge Instructions      Your COVID test is positive.    Use the albuterol inhaler and take the prednisone as  directed.    Follow up with your primary care provider on  Monday.  Go to the emergency department if you have worsening symptoms.        ED Prescriptions     Medication Sig Dispense Auth. Provider   albuterol (VENTOLIN HFA) 108 (90 Base) MCG/ACT inhaler Inhale 1-2 puffs into the lungs every 6 (six) hours as needed. 18 g Mickie Bail, NP   predniSONE (STERAPRED UNI-PAK 21 TAB) 10 MG (21) TBPK tablet Take by mouth daily. As directed 21 tablet Mickie Bail, NP      PDMP not reviewed this encounter.   Mickie Bail, NP 06/09/23 [phone]

## 2023-06-09 NOTE — ED Triage Notes (Signed)
Patient to Urgent Care with complaints of cough/ chest congestion/ wheezing. Denies any known fevers.  Symptoms started 4 days ago. Reports daughter was coughing last week.   No otc meds.

## 2023-06-09 NOTE — Discharge Instructions (Addendum)
Your COVID test is positive.    Use the albuterol inhaler and take the prednisone as directed.    Follow up with your primary care provider on Monday.  Go to the emergency department if you have worsening symptoms.

## 2023-07-20 ENCOUNTER — Ambulatory Visit
Admission: EM | Admit: 2023-07-20 | Discharge: 2023-07-20 | Disposition: A | Discharge status: Home / Self Care | Attending: Emergency Medicine | Admitting: Emergency Medicine

## 2023-07-20 ENCOUNTER — Encounter: Payer: Self-pay | Admitting: Emergency Medicine

## 2023-07-20 DIAGNOSIS — A084 Viral intestinal infection, unspecified: Secondary | ICD-10-CM | POA: Diagnosis not present

## 2023-07-20 MED ORDER — ONDANSETRON 4 MG PO TBDP: 4.0000 mg | ORAL_TABLET | Freq: Three times a day (TID) | ORAL | 0 refills | Status: AC | PRN

## 2023-07-20 MED ORDER — DIPHENOXYLATE-ATROPINE 2.5-0.025 MG PO TABS: 1.0000 | ORAL_TABLET | Freq: Four times a day (QID) | ORAL | 0 refills | Status: AC | PRN

## 2023-07-20 NOTE — ED Provider Notes (Signed)
Justin Harding    CSN: [id number] Arrival date & time: 07/20/23  4098      History   Chief Complaint Chief Complaint  Patient presents with   Emesis    HPI Justin Harding is a 42 y.o. male.   Patient presents for evaluation of nausea vomiting, diarrhea and abdominal pain beginning 1 day ago.  Last occurrence of overnight, denies emesis but persistent gagging.  Last occurrence of diarrhea this morning around 7 AM, described as watery.  Abdominal pain is generalized, worsened when symptoms occur.  No known sick contacts prior.  Denies dietary change, recent travel.  Has attempted use of Imodium.  Unable to tolerate food or liquids at this time  Past Medical History:  Diagnosis Date   Concussion 03/26/2015   Diabetes mellitus without complication [facility])     Patient Active Problem List   Diagnosis Date Noted   Eczematous dermatitis of eyelid of right eye 04/25/2017   Traumatic brain injury, closed (HCC) 04/15/2015   Concussion w/o coma 04/15/2015   Trapezius muscle strain 04/15/2015    Past Surgical History:  Procedure Laterality Date   ROTATOR CUFF REPAIR  2009   WISDOM TOOTH EXTRACTION  2004       Home Medications    Prior to Admission medications   Medication Sig Start Date End Date Taking? Authorizing Provider  diphenoxylate-atropine (LOMOTIL) 2.5-0.025 MG tablet Take 1 tablet by mouth 4 (four) times daily as needed for diarrhea or loose stools. 07/20/23  Yes Makenley Shimp R, NP  ondansetron (ZOFRAN-ODT) 4 MG disintegrating tablet Take 1 tablet (4 mg total) by mouth every 8 (eight) hours as needed. 07/20/23  Yes Ainhoa Rallo R, NP  albuterol (VENTOLIN HFA) 108 (90 Base) MCG/ACT inhaler Inhale 1-2 puffs into the lungs every 6 (six) hours as needed. 06/09/23   Mickie Bail, NP  doxycycline (VIBRA-TABS) 100 MG tablet Take 1 tablet (100 mg total) by mouth 2 (two) times daily. Patient not taking: Reported on 06/09/2023 10/12/17   Olevia Perches P, DO   metFORMIN (GLUCOPHAGE-XR) 500 MG 24 hr tablet Take by mouth. Patient not taking: Reported on 07/20/2023 05/11/23   [provider]  methylphenidate (RITALIN) 5 MG tablet  02/10/21   [provider]  predniSONE (STERAPRED UNI-PAK 21 TAB) 10 MG (21) TBPK tablet Take by mouth daily. As directed Patient not taking: Reported on 07/20/2023 06/09/23   Mickie Bail, NP  triamcinolone ointment (KENALOG) 0.1 % Apply 1 application topically 2 (two) times daily. Patient not taking: Reported on 10/12/2017 04/25/17   Gabriel Cirri, NP  venlafaxine [facility]) 100 MG tablet Take 100 mg by mouth daily.    [provider]    Family History Family History  Problem Relation Age of Onset   Cancer Mother        tumor removed from leg   Diabetes Mother    Hyperlipidemia Mother    Hypertension Mother    Arthritis Father    Cancer Father        prostate   Hyperlipidemia Father    Hypertension Father     Social History Social History   Tobacco Use   Smoking status: Never   Smokeless tobacco: Former    Types: Chew    Quit date: 05/25/2016  Vaping Use   Vaping status: Never Used  Substance Use Topics   Alcohol use: Yes    Alcohol/week: 1.0 standard drink of alcohol    Types: 1 Cans of beer per week  Comment: occasional beer   Drug use: No     Allergies   Patient has no known allergies.   Review of Systems Review of Systems   Physical Exam Triage Vital Signs ED Triage Vitals  Encounter Vitals Group     BP 07/20/23 0917 116/79     Systolic BP Percentile --      Diastolic BP Percentile --      Pulse Rate 07/20/23 0917 (!) 102     Resp 07/20/23 0917 20     Temp 07/20/23 0917 98.9 F (37.2 C)     Temp Source 07/20/23 0917 Oral     SpO2 07/20/23 0917 96 %     Weight --      Height --      Head Circumference --      Peak Flow --      Pain Score 07/20/23 0914 6     Pain Loc --      Pain Education --      Exclude from Growth Chart --    No data  found.  Updated Vital Signs BP 116/79 (BP Location: Left Arm) Comment (BP Location): large cuff  Pulse (!) 102   Temp 98.9 F (37.2 C) (Oral)   Resp 20   SpO2 96%   Visual Acuity Right Eye Distance:   Left Eye Distance:   Bilateral Distance:    Right Eye Near:   Left Eye Near:    Bilateral Near:     Physical Exam Constitutional:      Appearance: Normal appearance.  Eyes:     Extraocular Movements: Extraocular movements intact.  Pulmonary:     Effort: Pulmonary effort is normal.  Abdominal:     General: Abdomen is flat. Bowel sounds are increased.     Palpations: Abdomen is soft.     Tenderness: There is abdominal tenderness.  Neurological:     Mental Status: He is alert and oriented to person, place, and time. Mental status is at baseline.      UC Treatments / Results  Labs (all labs ordered are listed, but only abnormal results are displayed) Labs Reviewed - No data to display  EKG   Radiology No results found.  Procedures Procedures (including critical care time)  Medications Ordered in UC Medications - No data to display  Initial Impression / Assessment and Plan / UC Course  I have reviewed the triage vital signs and the nursing notes.  Pertinent labs & imaging results that were available during my care of the patient were reviewed by me and considered in my medical decision making (see chart for details).  Viral gastroenteritis  Vitals are stable, patient is in no signs of distress nontoxic-appearing, generalized abdominal tenderness noted on exam with increased bowel sounds, expected finding with nausea and vomiting, stable for outpatient management, prescribe Zofran and Lomotil, advised discontinuation of Imodium, may take over-the-counter analgesics for pain, advised increase fluid intake with fluids until able to tolerate foods and advise follow-up for worsening symptoms Final Clinical Impressions(s) / UC Diagnoses   Final diagnoses:  Viral  gastroenteritis     Discharge Instructions      Your symptoms are most likely caused by a virus, it will work its way out your system over the next few days  You can use zofran every 8 hours as needed for nausea, be mindful this medication may make you drowsy, take the first dose at home to see how it affects your body  You can  use lomotil every 6 hours to help with diarrhea, and be mindful over use of this medication may cause opposite effect constipation, stop complete use of Imodium  You can use over-the-counter ibuprofen or Tylenol, which ever you have at home, to help manage fevers  Continue to promote hydration throughout the day by using electrolyte replacement solution such as Gatorade, body armor, Pedialyte, which ever you have at home  Try eating bland foods such as bread, rice, toast, fruit which are easier on the stomach to digest, avoid foods that are overly spicy, overly seasoned or greasy    ED Prescriptions     Medication Sig Dispense Auth. Provider   ondansetron (ZOFRAN-ODT) 4 MG disintegrating tablet Take 1 tablet (4 mg total) by mouth every 8 (eight) hours as needed. 20 tablet Zoran Yankee R, NP   diphenoxylate-atropine (LOMOTIL) 2.5-0.025 MG tablet Take 1 tablet by mouth 4 (four) times daily as needed for diarrhea or loose stools. 15 tablet Gerri Acre, Elita Boone, NP      I have reviewed the PDMP during this encounter.   Valinda Hoar, NP 07/20/23 1021

## 2023-07-20 NOTE — ED Triage Notes (Signed)
Nausea, vomiting and diarrhea started yesterday.  Chills and sweats.    Has taken imodium.

## 2023-07-20 NOTE — Discharge Instructions (Signed)
Your symptoms are most likely caused by a virus, it will work its way out your system over the next few days  You can use zofran every 8 hours as needed for nausea, be mindful this medication may make you drowsy, take the first dose at home to see how it affects your body  You can use lomotil every 6 hours to help with diarrhea, and be mindful over use of this medication may cause opposite effect constipation, stop complete use of Imodium  You can use over-the-counter ibuprofen or Tylenol, which ever you have at home, to help manage fevers  Continue to promote hydration throughout the day by using electrolyte replacement solution such as Gatorade, body armor, Pedialyte, which ever you have at home  Try eating bland foods such as bread, rice, toast, fruit which are easier on the stomach to digest, avoid foods that are overly spicy, overly seasoned or greasy

## 2023-12-20 ENCOUNTER — Encounter: Payer: Self-pay | Admitting: Urology

## 2023-12-20 ENCOUNTER — Ambulatory Visit (INDEPENDENT_AMBULATORY_CARE_PROVIDER_SITE_OTHER): Admitting: Urology

## 2023-12-20 VITALS — BP 142/94 | HR 82 | Ht 63.0 in | Wt 190.0 lb

## 2023-12-20 DIAGNOSIS — Z3009 Encounter for other general counseling and advice on contraception: Secondary | ICD-10-CM

## 2023-12-20 NOTE — Patient Instructions (Signed)

## 2023-12-20 NOTE — Progress Notes (Signed)
 12/20/2023 8:44 AM   Justin Harding 08-07-1980 [id number]  Referring provider: Aileen Alexanders, NP [address] [place],  [place] [address]  Chief Complaint  Patient presents with   VAS Consult    HPI: Justin Harding is a 43 y.o. male who presents for vasectomy counseling.  He has 4 daughters Denies prior history urologic problems including chronic scrotal pain, epididymitis or orchitis No previous history inguinal hernia or pelvic surgery No history of bleeding or clotting disorders   PMH: Past Medical History:  Diagnosis Date   Concussion 03/26/2015   Diabetes mellitus without complication [facility])     Surgical History: Past Surgical History:  Procedure Laterality Date   ROTATOR CUFF REPAIR  2009   WISDOM TOOTH EXTRACTION  2004    Home Medications:  Allergies as of 12/20/2023   No Known Allergies      Medication List        Accurate as of Dec 20, 2023  8:44 AM. If you have any questions, ask your nurse or doctor.          albuterol  108 (90 Base) MCG/ACT inhaler Commonly known as: VENTOLIN  HFA Inhale 1-2 puffs into the lungs every 6 (six) hours as needed.   diphenoxylate -atropine  2.5-0.025 MG tablet Commonly known as: Lomotil  Take 1 tablet by mouth 4 (four) times daily as needed for diarrhea or loose stools.   doxycycline  100 MG tablet Commonly known as: VIBRA -TABS Take 1 tablet (100 mg total) by mouth 2 (two) times daily.   metFORMIN 500 MG 24 hr tablet Commonly known as: GLUCOPHAGE-XR Take by mouth.   methylphenidate 5 MG tablet Commonly known as: RITALIN   ondansetron  4 MG disintegrating tablet Commonly known as: ZOFRAN -ODT Take 1 tablet (4 mg total) by mouth every 8 (eight) hours as needed.   predniSONE  10 MG (21) Tbpk tablet Commonly known as: STERAPRED UNI-PAK 21 TAB Take by mouth daily. As directed   triamcinolone  ointment 0.1 % Commonly known as: KENALOG  Apply 1 application topically 2 (two) times daily.   venlafaxine  100 MG tablet Commonly known as: EFFEXOR Take 100 mg by mouth daily.        Allergies: No Known Allergies  Family History: Family History  Problem Relation Age of Onset   Cancer Mother        tumor removed from leg   Diabetes Mother    Hyperlipidemia Mother    Hypertension Mother    Arthritis Father    Cancer Father        prostate   Hyperlipidemia Father    Hypertension Father     Social History:  reports that he has never smoked. He quit smokeless tobacco use about 7 years ago.  His smokeless tobacco use included chew. He reports current alcohol use of about 1.0 standard drink of alcohol per week. He reports that he does not use drugs.   Physical Exam: BP (!) 142/94   Pulse 82   Ht 5\' 3"  (1.6 m)   Wt 190 lb (86.2 kg)   BMI 33.66 kg/m   Constitutional:  Alert and oriented, No acute distress. HEENT: [place] AT Respiratory: Normal respiratory effort, no increased work of breathing. GI: Abdomen is soft, nontender, nondistended, no abdominal masses GU: Phallus without lesions, ventral piercing; testes descended bilaterally without masses or tenderness, spermatic cord/epididymis palpably normal bilaterally.  Vasa palpable bilaterally   Assessment & Plan:    1.  Undesired fertility Desires to schedule vasectomy We had a long discussion about  vasectomy. We specifically discussed the procedure, recovery and the risks, benefits and alternatives of vasectomy. I explained that the procedure entails removal of a segment of each vas deferens, each of which conducts sperm, and that the purpose of this procedure is to cause sterility (inability to produce children or cause pregnancy). Vasectomy is intended to be permanent and irreversible form of contraception. Options for fertility after vasectomy include vasectomy reversal, or sperm retrieval with in vitro fertilization. These options are not always successful, and they may be expensive. We discussed the importance of avoiding strenuous  exercise for four days after vasectomy, and the importance of refraining from any form of ejaculation for seven days after vasectomy. I explained that vasectomy does not produce immediate sterility so another form of contraceptive must be used until sterility is assured by having semen checked for sperm. Thus, a post vasectomy semen analysis is necessary to confirm sterility. Rarely, vasectomy must be repeated. We discussed the approximately 1 in 2,000 risk of pregnancy after vasectomy for men who have post-vasectomy semen analysis showing absent sperm or rare non-motile sperm. Typical side effects include a small amount of oozing blood, some discomfort and mild swelling in the area of incision.  Vasectomy does not affect sexual performance, function, please, sensation, interest, desire, satisfaction, penile erection, volume of semen or ejaculation. Other rare risks include allergy or adverse reaction to an anesthetic, testicular atrophy, hematoma, infection/abscess, prolonged tenderness of the vas deferens, pain, swelling, painful nodule or scar (called sperm granuloma) or epididymtis. We discussed chronic testicular pain syndrome. This has been reported to occur in as many as 1-2% of men and may be permanent. This can be treated with medication, small procedures or (rarely) surgery. He indicated he would call back if he desires a preprocedure anxiolytic and would need a driver if utilizing   Geraline Knapp, MD  [facility] [address], [address] [place], [place] [address] [phone]

## 2024-01-31 ENCOUNTER — Ambulatory Visit (INDEPENDENT_AMBULATORY_CARE_PROVIDER_SITE_OTHER): Admitting: Urology

## 2024-01-31 ENCOUNTER — Encounter: Payer: Self-pay | Admitting: Urology

## 2024-01-31 VITALS — BP 127/85 | HR 80 | Ht 63.0 in | Wt 205.0 lb

## 2024-01-31 DIAGNOSIS — Z302 Encounter for sterilization: Secondary | ICD-10-CM | POA: Diagnosis not present

## 2024-01-31 MED ORDER — HYDROCODONE-ACETAMINOPHEN 5-325 MG PO TABS: 1.0000 | ORAL_TABLET | Freq: Four times a day (QID) | ORAL | 0 refills | Status: AC | PRN

## 2024-01-31 NOTE — Patient Instructions (Signed)

## 2024-01-31 NOTE — Progress Notes (Signed)
   Vasectomy Procedure Note  Indications: Justin Harding is a 43 y.o. male who presents today for elective sterilization.  He has been consented for the procedure.  He is aware of the risks and benefits.  He had no additional questions.  He agrees to proceed.  He denies any other significant change since his last visit.  Pre-operative Diagnosis: Elective sterilization  Post-operative Diagnosis: Elective sterilization  Premedication: N/A   Surgeon: Glendia C. Mariacristina Aday, M.D  Description: The patient was prepped and draped in the standard fashion.  The right vas deferens was identified and brought superiorly to the anterior scrotal skin.  The skin and vas were then anesthetized utilizing 8 ml 1% lidocaine .  A small stab incision was made and spread with the vas dissector.  The vas was grasped utilizing the vas clamp and elevated out of the incision. The vas sheath was incised and the vas was dissected out of the sheath, elevated and dissected free from the surrounding tissue and vessels.  A 1 cm segment was excised. The vas lumens were cauterized with a needle tip electrocautery for a distance of at least 1 cm.  Fascial interposition was performed on the testicular end with a figure-of-eight 3-0 chromic suture.   No significant bleeding was observed.  The vas ends were then dropped back into the hemiscrotum.  The skin was closed with hemostatic pressure.  An identical procedure was performed on the contralateral side.  Clean dry gauze was applied to the incision sites.  The patient tolerated the procedure well.  Complications:None  Recommendations: 1.  No lifting greater than 10 pounds or strenuous activity for 1 week. 2.  Scrotal support for 1-2 weeks. 3.  May shower in 24 hours; no bath, hot tub for 1 week 4.  No intercourse for at least 7 days and resume based on level of discomfort  5.  Continue alternate contraception for 12 weeks.  6.  Call for significant pain, swelling, redness,  drainage or fever greater than 100.5. 7.  Rx hydrocodone /APAP 5/325 1-2 every 6 hours prn pain. 8.  Follow-up semen analysis in 12 weeks.   Glendia Barba, MD

## 2024-04-30 ENCOUNTER — Other Ambulatory Visit: Payer: Self-pay

## 2024-04-30 DIAGNOSIS — Z302 Encounter for sterilization: Secondary | ICD-10-CM

## 2024-05-02 ENCOUNTER — Other Ambulatory Visit

## 2024-05-08 ENCOUNTER — Other Ambulatory Visit

## 2024-05-08 DIAGNOSIS — Z302 Encounter for sterilization: Secondary | ICD-10-CM

## 2024-05-10 LAB — POST-VAS SPERM EVALUATION,QUAL: Volume: 2.2 mL

## 2024-05-13 ENCOUNTER — Ambulatory Visit: Payer: Self-pay | Admitting: Urology
